# Patient Record
Sex: Female | Born: 2000
Health system: Southern US, Community
[De-identification: ages and names within clinical notes are randomized; demographics above are authoritative.]

---
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
## 2021-01-28 DIAGNOSIS — Z6822 Body mass index (BMI) 22.0-22.9, adult: Secondary | ICD-10-CM | POA: Diagnosis not present

## 2021-01-28 DIAGNOSIS — Z20822 Contact with and (suspected) exposure to covid-19: Secondary | ICD-10-CM | POA: Diagnosis not present

## 2021-01-28 DIAGNOSIS — R059 Cough, unspecified: Secondary | ICD-10-CM | POA: Diagnosis not present

## 2021-01-28 DIAGNOSIS — U071 COVID-19: Secondary | ICD-10-CM | POA: Diagnosis not present

## 2021-01-28 DIAGNOSIS — Z1152 Encounter for screening for COVID-19: Secondary | ICD-10-CM | POA: Diagnosis not present

## 2021-01-28 DIAGNOSIS — F17290 Nicotine dependence, other tobacco product, uncomplicated: Secondary | ICD-10-CM | POA: Diagnosis not present

## 2021-02-22 DIAGNOSIS — J029 Acute pharyngitis, unspecified: Secondary | ICD-10-CM | POA: Diagnosis not present

## 2021-02-22 DIAGNOSIS — B085 Enteroviral vesicular pharyngitis: Secondary | ICD-10-CM | POA: Diagnosis not present

## 2021-05-06 DIAGNOSIS — R5383 Other fatigue: Secondary | ICD-10-CM | POA: Diagnosis not present

## 2021-05-06 DIAGNOSIS — Z20822 Contact with and (suspected) exposure to covid-19: Secondary | ICD-10-CM | POA: Diagnosis not present

## 2021-05-06 DIAGNOSIS — R519 Headache, unspecified: Secondary | ICD-10-CM | POA: Diagnosis not present

## 2021-05-06 DIAGNOSIS — J00 Acute nasopharyngitis [common cold]: Secondary | ICD-10-CM | POA: Diagnosis not present

## 2021-05-06 DIAGNOSIS — Z03818 Encounter for observation for suspected exposure to other biological agents ruled out: Secondary | ICD-10-CM | POA: Diagnosis not present

## 2021-05-22 DIAGNOSIS — R300 Dysuria: Secondary | ICD-10-CM | POA: Diagnosis not present

## 2021-05-22 DIAGNOSIS — N898 Other specified noninflammatory disorders of vagina: Secondary | ICD-10-CM | POA: Diagnosis not present

## 2021-05-27 DIAGNOSIS — B3731 Acute candidiasis of vulva and vagina: Secondary | ICD-10-CM | POA: Diagnosis not present

## 2021-05-27 DIAGNOSIS — Z113 Encounter for screening for infections with a predominantly sexual mode of transmission: Secondary | ICD-10-CM | POA: Diagnosis not present

## 2021-05-27 DIAGNOSIS — Z01419 Encounter for gynecological examination (general) (routine) without abnormal findings: Secondary | ICD-10-CM | POA: Diagnosis not present

## 2021-05-27 DIAGNOSIS — Z6822 Body mass index (BMI) 22.0-22.9, adult: Secondary | ICD-10-CM | POA: Diagnosis not present

## 2024-05-23 ENCOUNTER — Ambulatory Visit: Payer: Self-pay

## 2024-05-23 VITALS — BP 104/73 | HR 88 | Temp 98.1°F | Ht 65.0 in | Wt 133.0 lb

## 2024-05-23 DIAGNOSIS — Z7689 Persons encountering health services in other specified circumstances: Secondary | ICD-10-CM

## 2024-05-23 DIAGNOSIS — Z113 Encounter for screening for infections with a predominantly sexual mode of transmission: Secondary | ICD-10-CM

## 2024-05-23 DIAGNOSIS — R5383 Other fatigue: Secondary | ICD-10-CM | POA: Diagnosis not present

## 2024-05-23 DIAGNOSIS — Z130 Encounter for screening for diseases of the blood and blood-forming organs and certain disorders involving the immune mechanism: Secondary | ICD-10-CM

## 2024-05-23 DIAGNOSIS — J019 Acute sinusitis, unspecified: Secondary | ICD-10-CM

## 2024-05-23 DIAGNOSIS — Z0000 Encounter for general adult medical examination without abnormal findings: Secondary | ICD-10-CM

## 2024-05-23 DIAGNOSIS — F419 Anxiety disorder, unspecified: Secondary | ICD-10-CM | POA: Diagnosis not present

## 2024-05-23 DIAGNOSIS — B9689 Other specified bacterial agents as the cause of diseases classified elsewhere: Secondary | ICD-10-CM

## 2024-05-23 DIAGNOSIS — Z13220 Encounter for screening for lipoid disorders: Secondary | ICD-10-CM

## 2024-05-23 DIAGNOSIS — Z0001 Encounter for general adult medical examination with abnormal findings: Secondary | ICD-10-CM | POA: Diagnosis not present

## 2024-05-23 MED ORDER — AMOXICILLIN-POT CLAVULANATE 875-125 MG PO TABS: 1.0000 | ORAL_TABLET | Freq: Two times a day (BID) | ORAL | 0 refills | Status: AC

## 2024-05-23 MED ORDER — ESCITALOPRAM OXALATE 10 MG PO TABS: 10.0000 mg | ORAL_TABLET | Freq: Every day | ORAL | 2 refills | Status: AC

## 2024-05-23 MED ORDER — FLUTICASONE PROPIONATE 50 MCG/ACT NA SUSP: 2.0000 | Freq: Every day | NASAL | 2 refills | Status: AC

## 2024-05-23 NOTE — Progress Notes (Signed)
 "  New Patient Office Visit  Subjective    Patient ID: Lindsey Rodgers, female    DOB: [birth year]  Age: 23 y.o. MRN: [id number]  HPI Lindsey Rodgers presents to establish care and for annual physical.   The patient comes in today for a wellness visit.  A review of their health history was completed. A review of medications was also completed.  Any needed refills; Lexapro   Eating habits: Good  Falls/  MVA accidents in past few months: No  Regular exercise: Yes, patient is a horticulturist, commercial  Sleep: Good  Menstrual cycles/sexual history: Currently sexually active. Has had three female sexual partners in the last year. Cycles are regular. Is on birth control that gynecology prescribes.   Specialist pt sees on regular basis: Sees gynecology regularly for pap smears.   Regular eye/dental exams: Yes  Preventative health issues were discussed.   Additional concerns: Patient reports sinus symptoms for two weeks now. Has tried prednisone, with no relief. Has also tried dayquil with no relief. Endorses congestion, sinus pain, rhinorrhea, cough, sore throat, and ear congestion. Denies fever, body aches, shortness of breath or wheezing.   No past medical history on file.  No past surgical history on file.  Family History  Problem Relation Age of Onset   Diabetes Maternal Grandfather     Social History   Socioeconomic History   Marital status: Single    Spouse name: Not on file   Number of children: Not on file   Years of education: Not on file   Highest education level: Not on file  Occupational History   Not on file  Tobacco Use   Smoking status: Never   Smokeless tobacco: Never  Vaping Use   Vaping status: Never Used  Substance and Sexual Activity   Alcohol use: No    Alcohol/week: 0.0 standard drinks of alcohol   Drug use: No   Sexual activity: Never  Other Topics Concern   Not on file  Social History Narrative   Not on file   Social Drivers of Health   Tobacco  Use: Low Risk (05/11/2024)   Received from [facility]   Patient History    Smoking Tobacco Use: Never    Smokeless Tobacco Use: Never    Passive Exposure: Never  Financial Resource Strain: Not on file  Food Insecurity: Not on file  Transportation Needs: Not on file  Physical Activity: Not on file  Stress: Not on file  Social Connections: Unknown (10/14/2021)   [facility]   Social Network    Social Network: Not on file  Intimate Partner Violence: Unknown (09/05/2021)   Received from [facility]   HITS    Physically Hurt: Not on file    Insult or Talk Down To: Not on file    Threaten Physical Harm: Not on file    Scream or Curse: Not on file  Depression (PHQ2-9): Low Risk (05/23/2024)   Depression (PHQ2-9)    PHQ-2 Score: 2  Alcohol Screen: Not on file  Housing: Not on file  Utilities: Not on file  Health Literacy: Not on file    Review of Systems  Constitutional:  Positive for fatigue. Negative for activity change, appetite change, chills and fever.  HENT:  Positive for congestion, ear pain, rhinorrhea, sinus pressure and sinus pain. Negative for sore throat.   Eyes:  Negative for visual disturbance.  Respiratory:  Negative for cough, shortness of breath and wheezing.   Cardiovascular:  Negative for chest pain.  Gastrointestinal:  Negative for constipation, diarrhea, nausea and vomiting.  Genitourinary:  Negative for difficulty urinating and menstrual problem.  Neurological:  Negative for headaches.  Psychiatric/Behavioral:  Negative for behavioral problems, decreased concentration and sleep disturbance. The patient is not nervous/anxious.     Objective    BP 104/73   Pulse 88   Temp 98.1 F (36.7 C)   Ht 5' 5 (1.651 m)   Wt 133 lb (60.3 kg)   SpO2 98%   BMI 22.13 kg/m   Physical Exam Vitals and nursing note reviewed.  Constitutional:      General: She is not in acute distress.    Appearance: Normal appearance. She is  well-developed. She is not ill-appearing.  HENT:     Right Ear: External ear normal. Tympanic membrane is retracted. Tympanic membrane is not erythematous or bulging.     Left Ear: External ear normal. Tympanic membrane is retracted. Tympanic membrane is not erythematous or bulging.     Nose: Congestion and rhinorrhea present.     Right Sinus: Maxillary sinus tenderness and frontal sinus tenderness present.     Left Sinus: Maxillary sinus tenderness and frontal sinus tenderness present.     Mouth/Throat:     Pharynx: No oropharyngeal exudate or posterior oropharyngeal erythema.  Neck:     Thyroid: No thyroid mass, thyromegaly or thyroid tenderness.  Cardiovascular:     Rate and Rhythm: Normal rate and regular rhythm.     Heart sounds: Normal heart sounds, S1 normal and S2 normal. No murmur heard. Pulmonary:     Effort: Pulmonary effort is normal. No respiratory distress.     Breath sounds: Normal breath sounds. No wheezing.  Abdominal:     General: There is no distension.     Palpations: Abdomen is soft.     Tenderness: There is no abdominal tenderness.     Hernia: No hernia is present.  Musculoskeletal:     Right lower leg: No edema.     Left lower leg: No edema.  Lymphadenopathy:     Cervical: No cervical adenopathy.  Skin:    General: Skin is warm and dry.  Neurological:     Mental Status: She is alert. Mental status is at baseline.  Psychiatric:        Mood and Affect: Mood normal.        Behavior: Behavior normal.        Thought Content: Thought content normal.        Judgment: Judgment normal.       05/23/2024    2:44 PM 04/24/2020    3:19 PM  Depression screen PHQ 2/9  Decreased Interest 0 0  Down, Depressed, Hopeless 0 0  PHQ - 2 Score 0 0  Altered sleeping 0   Tired, decreased energy 2   Feeling bad or failure about yourself  0   Trouble concentrating 0   Moving slowly or fidgety/restless 0   Suicidal thoughts 0   PHQ-9 Score 2   Difficult doing  work/chores Somewhat difficult        05/23/2024    2:45 PM  GAD 7 : Generalized Anxiety Score  Nervous, Anxious, on Edge 1  Control/stop worrying 2  Worry too much - different things 2  Trouble relaxing 0  Restless 0  Easily annoyed or irritable 1  Afraid - awful might happen 0  Total GAD 7 Score 6  Anxiety Difficulty Somewhat difficult   Assessment & Plan:  1. Encounter to establish care (Primary) -Advised patient to get previous pap smear results sent to our office. Also requested patient to get vaccination records. Patient agrees.  2. Well woman exam without gynecological exam Adult wellness-complete.wellness physical was conducted today. Importance of diet and exercise were discussed in detail.  Importance of stress reduction and healthy living were discussed.  In addition to this a discussion regarding safety was also covered.  We also reviewed over immunizations and gave recommendations regarding current immunization needed for age.   In addition to this additional areas were also touched on including: Preventative health exams needed: None   Patient was advised yearly wellness exam  - CMP14+EGFR  3. Screening for lipid disorders - Lipid Panel  4. Screening for deficiency anemia - CBC with Differential  5. Fatigue, unspecified type - CBC with Differential - TSH + free T4  6. Screening for STD (sexually transmitted disease) - HIV Antibody (routine testing w rflx) - Hepatitis C antibody  7. Acute bacterial sinusitis -Due to duration of symptoms being for over two weeks and exam, will treat for bacterial sinusitis at this time. Educated patient on appropriate use and potential side effects.   -Instructed patient on the use of flonase .  -Advised patient that if she experiences double worsening, fever, body aches or chills, to let us  know.  - amoxicillin -clavulanate (AUGMENTIN ) 875-125 MG tablet; Take 1 tablet by mouth 2 (two) times daily.  Dispense: 14 tablet;  Refill: 0 - fluticasone  (FLONASE ) 50 MCG/ACT nasal spray; Place 2 sprays into both nostrils daily.  Dispense: 16 g; Refill: 2  8. Anxiety -Increased patient's medication to 10 mg at this time. Educated patient on appropriate use and potential side effects.  -Box warnings reviewed for patient's age. -Monitor for any worsening symptoms or side effects, particularly thoughts of self-harm.   - escitalopram  (LEXAPRO ) 10 MG tablet; Take 1 tablet (10 mg total) by mouth daily.  Dispense: 30 tablet; Refill: 2   Return in about 6 months (around 11/21/2024).   Damien KATHEE Pringle, FNP  "
